# Patient Record
Sex: Male | Born: 1970 | Race: White | Hispanic: No | Marital: Single | Smoking: Current every day smoker
Health system: Southern US, Community
[De-identification: ages and names within clinical notes are randomized; demographics above are authoritative.]

## PROBLEM LIST (undated history)

## (undated) HISTORY — PX: ANKLE FRACTURE SURGERY: SHX122

## (undated) HISTORY — PX: LEG SURGERY: SHX1003

---

## 2004-12-02 ENCOUNTER — Emergency Department: Payer: Self-pay | Admitting: Emergency Medicine

## 2010-06-30 ENCOUNTER — Inpatient Hospital Stay: Payer: Self-pay | Admitting: Specialist

## 2010-11-16 ENCOUNTER — Emergency Department: Payer: Self-pay | Admitting: Emergency Medicine

## 2011-02-02 ENCOUNTER — Ambulatory Visit: Payer: Self-pay | Admitting: Oncology

## 2011-02-13 ENCOUNTER — Ambulatory Visit: Payer: Self-pay | Admitting: Oncology

## 2011-03-16 ENCOUNTER — Ambulatory Visit: Payer: Self-pay | Admitting: Oncology

## 2012-11-08 ENCOUNTER — Emergency Department: Payer: Self-pay | Admitting: Internal Medicine

## 2014-02-04 ENCOUNTER — Emergency Department: Payer: Self-pay | Admitting: Emergency Medicine

## 2014-03-15 DIAGNOSIS — S36039A Unspecified laceration of spleen, initial encounter: Secondary | ICD-10-CM

## 2014-03-15 HISTORY — DX: Unspecified laceration of spleen, initial encounter: S36.039A

## 2014-07-01 ENCOUNTER — Emergency Department
Admit: 2014-07-01 | Disposition: A | Discharge status: Home / Self Care | Payer: Self-pay | Admitting: Emergency Medicine

## 2014-07-01 LAB — ETHANOL: Ethanol: 271 mg/dL

## 2014-07-04 ENCOUNTER — Emergency Department
Admit: 2014-07-04 | Disposition: A | Discharge status: Home / Self Care | Payer: Self-pay | Admitting: Emergency Medicine

## 2015-04-06 ENCOUNTER — Encounter: Payer: Self-pay | Admitting: Emergency Medicine

## 2015-04-06 ENCOUNTER — Emergency Department
Admission: EM | Admit: 2015-04-06 | Discharge: 2015-04-06 | Disposition: A | Discharge status: Home / Self Care | Payer: Self-pay | Attending: Emergency Medicine | Admitting: Emergency Medicine

## 2015-04-06 ENCOUNTER — Emergency Department: Payer: Self-pay

## 2015-04-06 DIAGNOSIS — F17200 Nicotine dependence, unspecified, uncomplicated: Secondary | ICD-10-CM | POA: Insufficient documentation

## 2015-04-06 DIAGNOSIS — Y998 Other external cause status: Secondary | ICD-10-CM | POA: Insufficient documentation

## 2015-04-06 DIAGNOSIS — Y9389 Activity, other specified: Secondary | ICD-10-CM | POA: Insufficient documentation

## 2015-04-06 DIAGNOSIS — Y9289 Other specified places as the place of occurrence of the external cause: Secondary | ICD-10-CM | POA: Insufficient documentation

## 2015-04-06 DIAGNOSIS — X58XXXA Exposure to other specified factors, initial encounter: Secondary | ICD-10-CM | POA: Insufficient documentation

## 2015-04-06 DIAGNOSIS — M7531 Calcific tendinitis of right shoulder: Secondary | ICD-10-CM | POA: Insufficient documentation

## 2015-04-06 MED ORDER — HYDROCODONE-ACETAMINOPHEN 5-325 MG PO TABS: 1.0000 | ORAL_TABLET | ORAL | Status: DC | PRN

## 2015-04-06 MED ORDER — HYDROCODONE-ACETAMINOPHEN 5-325 MG PO TABS
2.0000 | ORAL_TABLET | Freq: Once | ORAL | Status: AC
  Administered 2015-04-06: 2 via ORAL
  Filled 2015-04-06: qty 2

## 2015-04-06 MED ORDER — PREDNISONE 10 MG PO TABS: ORAL_TABLET | ORAL | Status: DC

## 2015-04-06 NOTE — ED Notes (Signed)
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 Pain getting worse.  Having difficulty using arm now because of pain.

## 2015-04-06 NOTE — ED Provider Notes (Signed)
[facility] Emergency Department Provider Note  ____________________________________________  Time seen: Approximately 11:05 AM  I have reviewed the triage vital signs and the nursing notes.   HISTORY  Chief Complaint Shoulder Pain   HPI Mario Potter is a 45 y.o. male is here complaining of right shoulder pain for 2 weeks. Patient states that he was working with a tree when the rope snapped pulling on his shoulder. He states that he has continued to have pain since that time. He has been taking ibuprofen 600 milligrams 3 times a day. He denies any prior problems with his arm or shoulder. During the night last night he began to get worse. Patient rates his pain as an 8/10.   History reviewed. No pertinent past medical history.  There are no active problems to display for this patient.   Past Surgical History  Procedure Laterality Date  . Leg surgery      Current Outpatient Rx  Name  Route  Sig  Dispense  Refill  . HYDROcodone-acetaminophen (NORCO/VICODIN) 5-325 MG tablet   Oral   Take 1 tablet by mouth every 4 (four) hours as needed for moderate pain.   20 tablet   0   . predniSONE (DELTASONE) 10 MG tablet      Take 6 tablets  today, on day 2 take 5 tablets, day 3 take 4 tablets, day 4 take 3 tablets, day 5 take  2 tablets and 1 tablet the last day   21 tablet   0     Allergies Review of patient's allergies indicates no known allergies.  History reviewed. No pertinent family history.  Social History Social History  Substance Use Topics  . Smoking status: Current Every Day Smoker  . Smokeless tobacco: None  . Alcohol Use: No    Review of Systems Constitutional: No fever/chills Cardiovascular: Denies chest pain. Respiratory: Denies shortness of breath. Gastrointestinal:  No nausea, no vomiting.   Genitourinary: Negative for dysuria. Musculoskeletal: Negative for back pain. Positive right shoulder pain. Skin: Negative for  rash. Neurological: Negative for headaches, focal weakness or numbness.  10-point ROS otherwise negative.  ____________________________________________   PHYSICAL EXAM:  VITAL SIGNS: ED Triage Vitals  Enc Vitals Group     BP 04/06/15 1034 133/84 mmHg     Pulse Rate 04/06/15 1034 77     Resp 04/06/15 1034 18     Temp 04/06/15 1034 97.9 F (36.6 C)     Temp Source 04/06/15 1034 Oral     SpO2 04/06/15 1034 100 %     Weight 04/06/15 1034 160 lb (72.576 kg)     Height 04/06/15 1034  (1.727 m)     Head Cir --      Peak Flow --      Pain Score 04/06/15 1035 8     Pain Loc --      Pain Edu? --      Excl. in GC? --     Constitutional: Alert and oriented. Well appearing and in no acute distress. Eyes: Conjunctivae are normal. PERRL. EOMI. Head: Atraumatic. Nose: No congestion/rhinnorhea. Neck: No stridor.  No tenderness on palpation posterior cervical spine. Cardiovascular: Normal rate, regular rhythm. Grossly normal heart sounds.  Good peripheral circulation. Respiratory: Normal respiratory effort.  No retractions. Lungs CTAB. Gastrointestinal: Soft and nontender. No distention.  Musculoskeletal: Right shoulder no gross deformity however there is marked tenderness on palpation of the trapezius and rhomboid muscle. There is also tenderness on palpation of the  AC joint area  and deltoid. Range of motion is markedly restricted secondary to patient's pain. There is some crepitus noted on range of motion. Motor sensory function is intact distal to this area. Neurologic:  Normal speech and language. No gross focal neurologic deficits are appreciated. No gait instability. Skin:  Skin is warm, dry and intact. No rash noted. Psychiatric: Mood and affect are normal. Speech and behavior are normal.  ____________________________________________   LABS (all labs ordered are listed, but only abnormal results are displayed)  Labs Reviewed - No data to display   RADIOLOGY  Right  shoulder x-ray per radiologist shows no evidence of fracture dislocation. There is however an area representing calcific tendinitis of the rotator cuff. Soft tissue unremarkable.  ____________________________________________   PROCEDURES  Procedure(s) performed: None  Critical Care performed: No  ____________________________________________   INITIAL IMPRESSION / ASSESSMENT AND PLAN / ED COURSE  Pertinent labs & imaging results that were available during my care of the patient were reviewed by me and considered in my medical decision making (see chart for details).  Patient was given Norco while in the emergency room. He is discharged on a prescription of prednisone 60 mg 6 day tapering dose with Norco. He is also given a note for work. He is follow-up with Dr. Joice Lofts if any continued problems.  ____________________________________________   FINAL CLINICAL IMPRESSION(S) / ED DIAGNOSES  Final diagnoses:  Calcific tendonitis of right shoulder      Tommi Rumps, PA-C 04/06/15 1218  Jene Every, MD 04/06/15 1556

## 2015-04-06 NOTE — Discharge Instructions (Signed)
Calcific Tendinitis Calcific tendinitis occurs when crystals of calcium are deposited in a tendon. Tendons are bands of strong, fibrous tissue that attach muscles to bones. Tendons are an important part of joints. They make the joint move and they absorb some of the stress that a joint receives during use. When calcium is deposited in the tendon, the tendon becomes stiff, painful, and it can become swollen. Calcific tendinitis occurs frequently in the shoulder joint, in a structure called the rotator cuff. CAUSES  The cause of calcific tendinitis is unclear. It may be associated with:  Overuse of the tendon, such as from repetitive motion.  Excess stress on the tendon.  Aging.  Repetitive, mild injuries. SYMPTOMS   Pain may or may not be present. If it is present, it may occur when moving the joint.  Tenderness when pressure is applied to the tendon.  A snapping or popping sound when the joint moves.  Decreased motion of the joint.  Difficulty sleeping due to pain in the joint. DIAGNOSIS  Your health care provider will perform a physical exam. Imaging tests may also be used to make the diagnosis. These may include X-rays, an MRI, or a CT scan. TREATMENT  Generally, calcific tendinitis resolves on its own. Treatment for pain of calcific tendinitis may include:  Taking over-the-counter medicines, such as anti-inflammatory drugs.  Applying ice packs to the joint.  Following a specific exercise program to keep the joint working properly.  Attending physical therapy sessions.  Avoiding activities that cause pain. Treatment for more severe calcific tendinitis may require:  Injecting cortisone steroids or pain relieving medicines into or around the joint.  Manipulating the joint after you are given medicine to numb the area (local anesthetic).  Inflating the joint with sterile fluid to increase the flexibility of the tendons.  Shock wave therapy, which involves focusing sound  waves on the joint. If other treatments do not work, surgery may be done to clean out the calcium deposits and repair the tendons where needed. Most people do not need surgery. HOME CARE INSTRUCTIONS   Only take over-the-counter or prescription medicines for pain, fever, or discomfort as directed by your health care provider.  Follow your health care provider's recommendations for activity and exercise. SEEK MEDICAL CARE IF:  You notice an increase in pain or numbness.  You develop new weakness.  You notice increased joint stiffness or a sensation of looseness in the joint.  You notice increasing redness, swelling, or warmth around the joint area. SEEK IMMEDIATE MEDICAL CARE IF:  You have a fever or persistent symptoms for more than 2 to 3 days.  You have a fever and your symptoms suddenly get worse. MAKE SURE YOU:  Understand these instructions.  Will watch your condition.  Will get help right away if you are not doing well or get worse.   This information is not intended to replace advice given to you by your health care provider. Make sure you discuss any questions you have with your health care provider.   Document Released: 12/09/2007 Document Revised: 11/20/2014 Document Reviewed: 06/10/2011 Elsevier Interactive Patient Education [employer]    Follow-up Dr. Joice Lofts if any continued problems. Take medication as directed. Norco if needed for severe pain. Begin taking prednisone as directed in a tapering dose. Wear the sling only for the next 2-3 days to prevent stiffness.

## 2015-06-19 ENCOUNTER — Encounter: Payer: Self-pay | Admitting: Emergency Medicine

## 2015-06-19 ENCOUNTER — Emergency Department
Admission: EM | Admit: 2015-06-19 | Discharge: 2015-06-19 | Disposition: A | Discharge status: Home / Self Care | Payer: No Typology Code available for payment source | Attending: Emergency Medicine | Admitting: Emergency Medicine

## 2015-06-19 ENCOUNTER — Emergency Department: Payer: No Typology Code available for payment source

## 2015-06-19 DIAGNOSIS — Y92410 Unspecified street and highway as the place of occurrence of the external cause: Secondary | ICD-10-CM | POA: Diagnosis not present

## 2015-06-19 DIAGNOSIS — Y939 Activity, unspecified: Secondary | ICD-10-CM | POA: Diagnosis not present

## 2015-06-19 DIAGNOSIS — S46011A Strain of muscle(s) and tendon(s) of the rotator cuff of right shoulder, initial encounter: Secondary | ICD-10-CM | POA: Insufficient documentation

## 2015-06-19 DIAGNOSIS — M25511 Pain in right shoulder: Secondary | ICD-10-CM | POA: Diagnosis present

## 2015-06-19 DIAGNOSIS — F17200 Nicotine dependence, unspecified, uncomplicated: Secondary | ICD-10-CM | POA: Insufficient documentation

## 2015-06-19 DIAGNOSIS — Y999 Unspecified external cause status: Secondary | ICD-10-CM | POA: Diagnosis not present

## 2015-06-19 DIAGNOSIS — F1210 Cannabis abuse, uncomplicated: Secondary | ICD-10-CM | POA: Insufficient documentation

## 2015-06-19 DIAGNOSIS — S46911A Strain of unspecified muscle, fascia and tendon at shoulder and upper arm level, right arm, initial encounter: Secondary | ICD-10-CM

## 2015-06-19 MED ORDER — CYCLOBENZAPRINE HCL 10 MG PO TABS: 10.0000 mg | ORAL_TABLET | Freq: Three times a day (TID) | ORAL | Status: DC | PRN

## 2015-06-19 MED ORDER — IBUPROFEN 800 MG PO TABS: 800.0000 mg | ORAL_TABLET | Freq: Three times a day (TID) | ORAL | Status: DC | PRN

## 2015-06-19 MED ORDER — KETOROLAC TROMETHAMINE 60 MG/2ML IM SOLN
60.0000 mg | Freq: Once | INTRAMUSCULAR | Status: AC
  Administered 2015-06-19: 60 mg via INTRAMUSCULAR
  Filled 2015-06-19: qty 2
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
## 2019-06-07 ENCOUNTER — Emergency Department: Payer: Self-pay

## 2019-06-07 ENCOUNTER — Emergency Department
Admission: EM | Admit: 2019-06-07 | Discharge: 2019-06-07 | Disposition: A | Discharge status: Home / Self Care | Payer: Self-pay | Attending: Emergency Medicine | Admitting: Emergency Medicine

## 2019-06-07 ENCOUNTER — Other Ambulatory Visit: Payer: Self-pay

## 2019-06-07 DIAGNOSIS — R079 Chest pain, unspecified: Secondary | ICD-10-CM

## 2019-06-07 DIAGNOSIS — F17200 Nicotine dependence, unspecified, uncomplicated: Secondary | ICD-10-CM

## 2019-06-07 DIAGNOSIS — F17210 Nicotine dependence, cigarettes, uncomplicated: Secondary | ICD-10-CM | POA: Insufficient documentation

## 2019-06-07 DIAGNOSIS — F1210 Cannabis abuse, uncomplicated: Secondary | ICD-10-CM | POA: Insufficient documentation

## 2019-06-07 DIAGNOSIS — R0789 Other chest pain: Secondary | ICD-10-CM | POA: Insufficient documentation

## 2019-06-07 DIAGNOSIS — H6123 Impacted cerumen, bilateral: Secondary | ICD-10-CM | POA: Insufficient documentation

## 2019-06-07 LAB — CBC WITH DIFFERENTIAL/PLATELET
Abs Immature Granulocytes: 0.07 10*3/uL (ref 0.00–0.07)
Basophils Absolute: 0.1 10*3/uL (ref 0.0–0.1)
Basophils Relative: 0 %
Eosinophils Absolute: 0 10*3/uL (ref 0.0–0.5)
Eosinophils Relative: 0 %
HCT: 52 % (ref 39.0–52.0)
Hemoglobin: 17.6 g/dL — ABNORMAL HIGH (ref 13.0–17.0)
Immature Granulocytes: 1 %
Lymphocytes Relative: 14 %
Lymphs Abs: 2 10*3/uL (ref 0.7–4.0)
MCH: 33.7 pg (ref 26.0–34.0)
MCHC: 33.8 g/dL (ref 30.0–36.0)
MCV: 99.6 fL (ref 80.0–100.0)
Monocytes Absolute: 1.3 10*3/uL — ABNORMAL HIGH (ref 0.1–1.0)
Monocytes Relative: 9 %
Neutro Abs: 11.1 10*3/uL — ABNORMAL HIGH (ref 1.7–7.7)
Neutrophils Relative %: 76 %
Platelets: 455 10*3/uL — ABNORMAL HIGH (ref 150–400)
RBC: 5.22 MIL/uL (ref 4.22–5.81)
RDW: 13.2 % (ref 11.5–15.5)
WBC: 14.5 10*3/uL — ABNORMAL HIGH (ref 4.0–10.5)
nRBC: 0 % (ref 0.0–0.2)

## 2019-06-07 LAB — TROPONIN I (HIGH SENSITIVITY): Troponin I (High Sensitivity): 3 ng/L (ref <18)

## 2019-06-07 LAB — BASIC METABOLIC PANEL
Anion gap: 12 (ref 5–15)
BUN: 14 mg/dL (ref 6–20)
CO2: 26 mmol/L (ref 22–32)
Calcium: 10.5 mg/dL — ABNORMAL HIGH (ref 8.9–10.3)
Chloride: 103 mmol/L (ref 98–111)
Creatinine, Ser: 0.95 mg/dL (ref 0.61–1.24)
GFR calc Af Amer: >60 mL/min (ref >60)
GFR calc non Af Amer: >60 mL/min (ref >60)
Glucose, Bld: 108 mg/dL — ABNORMAL HIGH (ref 70–99)
Potassium: 3.5 mmol/L (ref 3.5–5.1)
Sodium: 141 mmol/L (ref 135–145)

## 2019-06-07 MED ORDER — CYCLOBENZAPRINE HCL 10 MG PO TABS: 10.0000 mg | ORAL_TABLET | Freq: Three times a day (TID) | ORAL | 0 refills | Status: DC | PRN

## 2019-06-07 MED ORDER — DEXAMETHASONE SODIUM PHOSPHATE 10 MG/ML IJ SOLN
10.0000 mg | Freq: Once | INTRAMUSCULAR | Status: AC
  Administered 2019-06-07: 10 mg via INTRAVENOUS
  Filled 2019-06-07: qty 1

## 2019-06-07 MED ORDER — KETOROLAC TROMETHAMINE 30 MG/ML IJ SOLN
30.0000 mg | Freq: Once | INTRAMUSCULAR | Status: AC
  Administered 2019-06-07: 30 mg via INTRAVENOUS
  Filled 2019-06-07: qty 1
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
## 2019-06-07 NOTE — ED Provider Notes (Signed)
[facility] Emergency Department Provider Note  ____________________________________________   First MD Initiated Contact with Patient 06/07/19 [phone]     (approximate)  I have reviewed the triage vital signs and the nursing notes.   HISTORY  Chief Complaint Chest Pain    HPI Mario Potter is a 49 y.o. male  Here with chest pain and neck pain.  The patient states that his chest pain started approximately 2 to 3 months ago.  He scribes it as an aching, cramping, but intermittently sharp left primarily posterior chest pain.  It wraps around to his front.  The pain is more noticeable when he is still, and he states it does improve somewhat when he gets up and stretches or presses on the area.  The pain is worse with some position changes.  He has a chronic cough due to smoking, but does feel this is possibly mildly worsened.  No hemoptysis.  No leg swelling.  He denies known history of coronary disease.  He does continue to smoke at least 1 pack daily.  He also complains of some intermittent pain and swelling sensation along the right lateral throat.  He states he feels like food gets stuck in his throat and he occasionally has difficulty swallowing.  This is also been ongoing for several months.  He has not seen a doctor for this.  Denies any weight loss or night sweats.  No known history of oral or lung cancer.  No fevers or chills.          History reviewed. No pertinent past medical history.  There are no problems to display for this patient.   Past Surgical History:  Procedure Laterality Date  . LEG SURGERY      Prior to Admission medications   Medication Sig Start Date End Date Taking? Authorizing Provider  cyclobenzaprine (FLEXERIL) 10 MG tablet Take 1 tablet (10 mg total) by mouth 3 (three) times daily as needed for muscle spasms (or muscle tension). 06/07/19   Shaune Pollack, MD  ibuprofen (ADVIL,MOTRIN) 800 MG tablet Take 1 tablet (800 mg total) by  mouth every 8 (eight) hours as needed. 06/19/15   Beers, Charmayne Sheer, PA-C    Allergies Patient has no known allergies.  No family history on file.  Social History Social History   Tobacco Use  . Smoking status: Current Every Day Smoker  . Smokeless tobacco: Never Used  Substance Use Topics  . Alcohol use: No  . Drug use: Yes    Types: Marijuana    Review of Systems  Review of Systems  Constitutional: Positive for fatigue. Negative for chills and fever.  HENT: Positive for trouble swallowing. Negative for sore throat.   Respiratory: Positive for cough and chest tightness. Negative for shortness of breath.   Cardiovascular: Negative for chest pain.  Gastrointestinal: Negative for abdominal pain.  Genitourinary: Negative for flank pain.  Musculoskeletal: Positive for neck pain.  Skin: Negative for rash and wound.  Allergic/Immunologic: Negative for immunocompromised state.  Neurological: Negative for weakness and numbness.  Hematological: Does not bruise/bleed easily.  All other systems reviewed and are negative.    ____________________________________________  PHYSICAL EXAM:      VITAL SIGNS: ED Triage Vitals  Enc Vitals Group     BP 06/07/19 0733 (!) 151/77     Pulse Rate 06/07/19 0733 69     Resp 06/07/19 0733 18     Temp 06/07/19 0733 98.2 F (36.8 C)     Temp  Source 06/07/19 0733 Oral     SpO2 06/07/19 0733 97 %     Weight 06/07/19 0727 170 lb (77.1 kg)     Height 06/07/19 0727 5\' 8"  (1.727 m)     Head Circumference --      Peak Flow --      Pain Score 06/07/19 0727 5     Pain Loc --      Pain Edu? --      Excl. in GC? --      Physical Exam Vitals and nursing note reviewed.  Constitutional:      General: He is not in acute distress.    Appearance: He is well-developed.  HENT:     Head: Normocephalic and atraumatic.  Eyes:     Conjunctiva/sclera: Conjunctivae normal.  Neck:     Comments: Mild TTP over R lateral hyoid bone. No appreciable masses or  LAD. No obvious abnormality on swallowing or speaking. No hoarseness. Cardiovascular:     Rate and Rhythm: Normal rate and regular rhythm.     Heart sounds: Normal heart sounds.  Pulmonary:     Effort: Pulmonary effort is normal. No respiratory distress.     Breath sounds: No wheezing.  Chest:     Comments: Moderate TTP along left posterior and lateral chest wall intercostal spaces. Normal WOB, breath sounds symmetric. Abdominal:     General: There is no distension.  Musculoskeletal:     Cervical back: Neck supple.  Skin:    General: Skin is warm.     Capillary Refill: Capillary refill takes less than 2 seconds.     Findings: No rash.  Neurological:     Mental Status: He is alert and oriented to person, place, and time.     Motor: No abnormal muscle tone.       ____________________________________________   LABS (all labs ordered are listed, but only abnormal results are displayed)  Labs Reviewed  CBC WITH DIFFERENTIAL/PLATELET - Abnormal; Notable for the following components:      Result Value   WBC 14.5 (*)    Hemoglobin 17.6 (*)    Platelets 455 (*)    Neutro Abs 11.1 (*)    Monocytes Absolute 1.3 (*)    All other components within normal limits  BASIC METABOLIC PANEL - Abnormal; Notable for the following components:   Glucose, Bld 108 (*)    Calcium 10.5 (*)    All other components within normal limits  TROPONIN I (HIGH SENSITIVITY)  TROPONIN I (HIGH SENSITIVITY)    ____________________________________________  EKG: Sinus bradycardia, VR 58. PR 182, QRS 96, QTc 382. No acute ST elevations or depressions. No ischemia or infarct. ________________________________________  RADIOLOGY All imaging, including plain films, CT scans, and ultrasounds, independently reviewed by me, and interpretations confirmed via formal radiology reads.  ED MD interpretation:   CXR clear XR Neck clear  Official radiology report(s): DG Neck Soft Tissue  Result Date: 06/07/2019  CLINICAL DATA:  Neck pain occasional swallowing difficulty. EXAM: NECK SOFT TISSUES - 1+ VIEW COMPARISON:  None. FINDINGS: There is no evidence of retropharyngeal soft tissue swelling or epiglottic enlargement. The cervical airway is unremarkable and no radio-opaque foreign body identified. IMPRESSION: Negative. Electronically Signed   By: 06/09/2019 M.D.   On: 06/07/2019 08:36   DG Chest 2 View  Result Date: 06/07/2019 CLINICAL DATA:  Chest pain, cough. EXAM: CHEST - 2 VIEW COMPARISON:  July 04, 2014. FINDINGS: The heart size and mediastinal contours are within normal  limits. Both lungs are clear. No pneumothorax or pleural effusion is noted. The visualized skeletal structures are unremarkable. IMPRESSION: No active cardiopulmonary disease. Electronically Signed   By: Lupita Raider M.D.   On: 06/07/2019 08:35    ____________________________________________  PROCEDURES   Procedure(s) performed (including Critical Care):  .Ear Cerumen Removal  Date/Time: 06/07/2019 10:00 AM Performed by: Shaune Pollack, MD Authorized by: Shaune Pollack, MD   Consent:    Consent obtained:  Written   Consent given by:  Patient   Risks discussed:  Bleeding, dizziness, infection, incomplete removal, pain and TM perforation   Alternatives discussed:  Alternative treatment Procedure details:    Location:  R ear and L ear   Procedure type: curette   Post-procedure details:    Inspection:  Macerated skin and TM intact   Hearing quality:  Improved   Patient tolerance of procedure:  Tolerated well, no immediate complications    ____________________________________________  INITIAL IMPRESSION / MDM / ASSESSMENT AND PLAN / ED COURSE  As part of my medical decision making, I reviewed the following data within the electronic MEDICAL RECORD NUMBER Nursing notes reviewed and incorporated, Old chart reviewed, Notes from prior ED visits, and [place] Controlled Substance Database       *Mario Potter was  evaluated in Emergency Department on 06/07/2019 for the symptoms described in the history of present illness. He was evaluated in the context of the global COVID-19 pandemic, which necessitated consideration that the patient might be at risk for infection with the SARS-CoV-2 virus that causes COVID-19. Institutional protocols and algorithms that pertain to the evaluation of patients at risk for COVID-19 are in a state of rapid change based on information released by regulatory bodies including the CDC and federal and state organizations. These policies and algorithms were followed during the patient's care in the ED.  Some ED evaluations and interventions may be delayed as a result of limited staffing during the pandemic.*  Clinical Course as of Jun 06 999  Thu Jun 07, 2019  0828 Suspect CP is MSK in etiology related to his fall and ongoing coughing from smoking. No tachycardia, tachypnea, hypoxia, leg swelling, or s/s DVT/PE. DDx includes occult PNA, less likely lung CA though he certainly is at risk. CXR pending. Neck pain could also be related to pharyngeal irritation from his chronic coughing. No palpable masses or LAD. No signs of airway compromise. Will ultimately need ENT evaluation.   [CI]    Clinical Course User Index [CI] Shaune Pollack, MD    Medical Decision Making: As above.  Chest pain is likely musculoskeletal in etiology.  He is not tachycardic, tachypneic, doubt PE.  Chest x-ray is clear.  EKG nonischemic and troponin negative despite constant symptoms for greater than 48 hours, do not suspect ACS.  Will treat with Flexeril.  There may also be component of both anxiety as well as acid reflux, and will have him stop his ibuprofen use as this could be contributing.  Will advise him to take Tylenol and start an over-the-counter antacid.  Regarding his jaw pain, plain film of the neck is unremarkable.  Of note, he had bilateral cerumen impactions which after removal, did significantly  improve his right upper neck pain, so this very well could have been referred from the ear canal.  I discussed that he would need outpatient follow-up with an ENT if symptoms do not improve, as well as regular cancer screenings given his tobacco use.  Counseled the patient extensively on  the benefits of quitting smoking.  Otherwise, no apparent emergent pathology.  ____________________________________________  FINAL CLINICAL IMPRESSION(S) / ED DIAGNOSES  Final diagnoses:  Left-sided chest pain  Bilateral impacted cerumen  Tobacco dependence     MEDICATIONS GIVEN DURING THIS VISIT:  Medications  ketorolac (TORADOL) 30 MG/ML injection 30 mg (30 mg Intravenous Given 06/07/19 0907)  dexamethasone (DECADRON) injection 10 mg (10 mg Intravenous Given 06/07/19 0907)     ED Discharge Orders         Ordered    cyclobenzaprine (FLEXERIL) 10 MG tablet  3 times daily PRN     06/07/19 0957           Note:  This document was prepared using Dragon voice recognition software and may include unintentional dictation errors.   Duffy Bruce, MD 06/07/19 1001

## 2019-06-07 NOTE — Discharge Instructions (Addendum)
For your chest pain:  Take ACETAMINOPHEN or TYLENOL instead of MOTRIN/IBUPROFEN or ASPIRIN Take the Flexeril that I have prescribed. This also helps with muscle tension. STOP SMOKING  For your jaw pain: Consider an over-the-counter ear drop like Debrox Do not stick QTips or other instruments into your ears  For your burning pain: Take over-the-counter OMEPRAZOLE (generic for Prilosec) once daily for 14 days, to see if this helps Take first thing in the morning This medication takes several days to work, but continue taking it as I have written

## 2019-09-18 ENCOUNTER — Telehealth: Payer: Self-pay | Admitting: General Practice

## 2019-09-18 NOTE — Telephone Encounter (Signed)
Individual has been contacted 3+ times regarding ED referral and has been given information regarding how to become a pt. No further attempts to contact individual will be made. 

## 2019-11-23 ENCOUNTER — Emergency Department: Payer: Self-pay

## 2019-11-23 ENCOUNTER — Emergency Department
Admission: EM | Admit: 2019-11-23 | Discharge: 2019-11-23 | Disposition: A | Discharge status: Home / Self Care | Payer: Self-pay | Attending: Emergency Medicine | Admitting: Emergency Medicine

## 2019-11-23 ENCOUNTER — Other Ambulatory Visit: Payer: Self-pay

## 2019-11-23 DIAGNOSIS — F1210 Cannabis abuse, uncomplicated: Secondary | ICD-10-CM | POA: Insufficient documentation

## 2019-11-23 DIAGNOSIS — F17200 Nicotine dependence, unspecified, uncomplicated: Secondary | ICD-10-CM | POA: Insufficient documentation

## 2019-11-23 DIAGNOSIS — M25571 Pain in right ankle and joints of right foot: Secondary | ICD-10-CM | POA: Insufficient documentation

## 2019-11-23 DIAGNOSIS — Z79899 Other long term (current) drug therapy: Secondary | ICD-10-CM | POA: Insufficient documentation

## 2019-11-23 DIAGNOSIS — K047 Periapical abscess without sinus: Secondary | ICD-10-CM | POA: Insufficient documentation

## 2019-11-23 DIAGNOSIS — R52 Pain, unspecified: Secondary | ICD-10-CM

## 2019-11-23 MED ORDER — MELOXICAM 15 MG PO TABS: 15.0000 mg | ORAL_TABLET | Freq: Every day | ORAL | 0 refills | Status: DC

## 2019-11-23 MED ORDER — AMOXICILLIN 500 MG PO TABS: 500.0000 mg | ORAL_TABLET | Freq: Three times a day (TID) | ORAL | 0 refills | Status: DC

## 2019-11-23 NOTE — Discharge Instructions (Signed)
Please follow up with a dentist as well as podiatry.  Return to the ER for symptoms that change or worsen if unable to schedule an appointment.

## 2019-11-23 NOTE — ED Triage Notes (Signed)
Pt states that he popped his rt ankle a week ago and cont to have swelling and discomfort, reports hx of plates and screws in his rt ankle that were removed, pt also states that he is having an abscessed tooth

## 2019-11-23 NOTE — ED Provider Notes (Signed)
[facility] Emergency Department Provider Note ____________________________________________  Time seen: Approximately 3:23 PM  I have reviewed the triage vital signs and the nursing notes.   HISTORY  Chief Complaint Ankle Pain    HPI Mario Potter is a 49 y.o. male who presents to the emergency department for evaluation and treatment of right ankle pain.  He has a history of fracture and couple of surgeries on the ankle to remove the hardware several years later.  He states that about a week ago he was just walking and his ankle popped.  It has continued to swell and has been painful.  Patient also states that he is experiencing an abscessed tooth  No past medical history on file.  There are no problems to display for this patient.   Past Surgical History:  Procedure Laterality Date   LEG SURGERY      Prior to Admission medications   Medication Sig Start Date End Date Taking? Authorizing Provider  amoxicillin (AMOXIL) 500 MG tablet Take 1 tablet (500 mg total) by mouth 3 (three) times daily. 11/23/19   Nicco Reaume, Rulon Eisenmenger B, FNP  cyclobenzaprine (FLEXERIL) 10 MG tablet Take 1 tablet (10 mg total) by mouth 3 (three) times daily as needed for muscle spasms (or muscle tension). 06/07/19   Shaune Pollack, MD  ibuprofen (ADVIL,MOTRIN) 800 MG tablet Take 1 tablet (800 mg total) by mouth every 8 (eight) hours as needed. 06/19/15   Beers, Charmayne Sheer, PA-C  meloxicam (MOBIC) 15 MG tablet Take 1 tablet (15 mg total) by mouth daily. 11/23/19   Chinita Pester, FNP    Allergies Patient has no known allergies.  No family history on file.  Social History Social History   Tobacco Use   Smoking status: Current Every Day Smoker   Smokeless tobacco: Never Used  Substance Use Topics   Alcohol use: No   Drug use: Yes    Types: Marijuana    Review of Systems Constitutional: Negative for fever. Cardiovascular: Negative for chest pain. ENT: Positive for dental  pain. Respiratory: Negative for shortness of breath. Musculoskeletal: Positive for right ankle pain Skin: Negative for open wounds or lesions. Neurological: Negative for decrease in sensation  ____________________________________________   PHYSICAL EXAM:  VITAL SIGNS: ED Triage Vitals [11/23/19 1229]  Enc Vitals Group     BP (!) 166/95     Pulse Rate 72     Resp 18     Temp 99.2 F (37.3 C)     Temp Source Oral     SpO2 99 %     Weight 175 lb (79.4 kg)     Height 5\' 8"  (1.727 m)     Head Circumference      Peak Flow      Pain Score 6     Pain Loc      Pain Edu?      Excl. in GC?     Constitutional: Alert and oriented. Well appearing and in no acute distress. Eyes: Conjunctivae are clear without discharge or drainage Head: Atraumatic Neck: Supple. ENT: Swelling and erythema over the lateral incisor on the left upper without an identified area of fluctuance. Respiratory: No cough. Respirations are even and unlabored. Musculoskeletal: Diffuse right ankle pain without deformity.  Able to perform limited range of motion.  Neurologic: Motor and sensory function is intact. Skin: No open wounds or lesions. Psychiatric: Affect and behavior are appropriate.  ____________________________________________   LABS (all labs ordered are listed, but only abnormal  results are displayed)  Labs Reviewed - No data to display ____________________________________________  RADIOLOGY  Image of the right ankle is negative for acute findings.  I, Kem Boroughs, personally viewed and evaluated these images (plain radiographs) as part of my medical decision making, as well as reviewing the written report by the radiologist.  DG Foot Complete Right  Result Date: 11/23/2019 CLINICAL DATA:  The patient felt a pop in his right ankle approximately 1 week ago with onset of pain. EXAM: RIGHT FOOT COMPLETE - 3+ VIEW COMPARISON:  None. FINDINGS: No fracture or dislocation is identified. Multiple  rounded calcifications project about the tibiotalar joint eccentric on the medial side. 2 calcifications are also seen posterior to the tibiotalar joint and subtalar joint. Tibiotalar joint space is maintained. IMPRESSION: No acute abnormality. 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 Electronically Signed   By: Drusilla Kanner M.D.   On: 11/23/2019 12:54   ____________________________________________   PROCEDURES  Procedures  ____________________________________________   INITIAL IMPRESSION / ASSESSMENT AND PLAN / ED COURSE  KOBEY SIDES is a 49 y.o. who presents to the emergency department for treatment and evaluation of right ankle pain.  See HPI for further details.  He is also here for treatment of dental abscess.  Plan will be to put him in a lace up brace and treat him for the dental abscess.  He'll receive prescriptions for amoxicillin and meloxicam.  He was encouraged to follow-up with podiatry if her symptoms are not improving over the week.  He is to rest, ice, and elevate over the weekend.  Medications - No data to display  Pertinent labs & imaging results that were available during my care of the patient were reviewed by me and considered in my medical decision making (see chart for details).   _________________________________________   FINAL CLINICAL IMPRESSION(S) / ED DIAGNOSES  Final diagnoses:  Pain  Acute right ankle pain  Dental abscess    ED Discharge Orders         Ordered    amoxicillin (AMOXIL) 500 MG tablet  3 times daily        11/23/19 1412    meloxicam (MOBIC) 15 MG tablet  Daily        11/23/19 1412           If controlled substance prescribed during this visit, 12 month history viewed on the [facility] prior to issuing an initial prescription for Schedule II or III opiod.   Chinita Pester,  FNP 11/23/19 1528    Gilles Chiquito, MD 11/23/19 [phone]

## 2019-11-23 NOTE — ED Notes (Signed)
Pt roomed by this RN in wheelchair. Pt stating his right ankle popped and he has previously broken it. Pt stating he has also noticed swelling to his face and arms.

## 2020-02-23 ENCOUNTER — Other Ambulatory Visit: Payer: Self-pay

## 2020-02-23 ENCOUNTER — Emergency Department: Payer: Self-pay

## 2020-02-23 ENCOUNTER — Encounter: Payer: Self-pay | Admitting: Emergency Medicine

## 2020-02-23 ENCOUNTER — Emergency Department
Admission: EM | Admit: 2020-02-23 | Discharge: 2020-02-23 | Disposition: A | Discharge status: Home / Self Care | Payer: Self-pay | Attending: Emergency Medicine | Admitting: Emergency Medicine

## 2020-02-23 DIAGNOSIS — M179 Osteoarthritis of knee, unspecified: Secondary | ICD-10-CM | POA: Insufficient documentation

## 2020-02-23 DIAGNOSIS — F17200 Nicotine dependence, unspecified, uncomplicated: Secondary | ICD-10-CM | POA: Insufficient documentation

## 2020-02-23 DIAGNOSIS — M1712 Unilateral primary osteoarthritis, left knee: Secondary | ICD-10-CM

## 2020-02-23 LAB — CBC WITH DIFFERENTIAL/PLATELET
Abs Immature Granulocytes: 0.03 10*3/uL (ref 0.00–0.07)
Basophils Absolute: 0.1 10*3/uL (ref 0.0–0.1)
Basophils Relative: 1 %
Eosinophils Absolute: 0.2 10*3/uL (ref 0.0–0.5)
Eosinophils Relative: 3 %
HCT: 48.3 % (ref 39.0–52.0)
Hemoglobin: 16.4 g/dL (ref 13.0–17.0)
Immature Granulocytes: 1 %
Lymphocytes Relative: 26 %
Lymphs Abs: 1.5 10*3/uL (ref 0.7–4.0)
MCH: 34.7 pg — ABNORMAL HIGH (ref 26.0–34.0)
MCHC: 34 g/dL (ref 30.0–36.0)
MCV: 102.1 fL — ABNORMAL HIGH (ref 80.0–100.0)
Monocytes Absolute: 0.6 10*3/uL (ref 0.1–1.0)
Monocytes Relative: 10 %
Neutro Abs: 3.5 10*3/uL (ref 1.7–7.7)
Neutrophils Relative %: 59 %
Platelets: 385 10*3/uL (ref 150–400)
RBC: 4.73 MIL/uL (ref 4.22–5.81)
RDW: 13.7 % (ref 11.5–15.5)
WBC: 5.8 10*3/uL (ref 4.0–10.5)
nRBC: 0 % (ref 0.0–0.2)

## 2020-02-23 LAB — BASIC METABOLIC PANEL
Anion gap: 11 (ref 5–15)
BUN: 11 mg/dL (ref 6–20)
CO2: 21 mmol/L — ABNORMAL LOW (ref 22–32)
Calcium: 9.4 mg/dL (ref 8.9–10.3)
Chloride: 107 mmol/L (ref 98–111)
Creatinine, Ser: 0.85 mg/dL (ref 0.61–1.24)
GFR, Estimated: >60 mL/min (ref >60)
Glucose, Bld: 88 mg/dL (ref 70–99)
Potassium: 4.2 mmol/L (ref 3.5–5.1)
Sodium: 139 mmol/L (ref 135–145)

## 2020-02-23 LAB — URIC ACID: Uric Acid, Serum: 5 mg/dL (ref 3.7–8.6)

## 2020-02-23 LAB — SEDIMENTATION RATE: Sed Rate: 2 mm/hr (ref 0–20)

## 2020-02-23 MED ORDER — MELOXICAM 15 MG PO TABS: 15.0000 mg | ORAL_TABLET | Freq: Every day | ORAL | 0 refills | Status: DC

## 2020-02-23 NOTE — ED Provider Notes (Signed)
[facility] Emergency Department Provider Note   ____________________________________________   Event Date/Time   First MD Initiated Contact with Patient 02/23/20 1301     (approximate)  I have reviewed the triage vital signs and the nursing notes.   HISTORY  Chief Complaint Knee Pain    HPI Mario Potter is a 49 y.o. male patient complains of 3 weeks of nontraumatic and intermitting right knee pain and edema.  Patient states he has a history of subluxation of patella which has never been evaluated by orthopedics.  Patient states pain has increased so he now walks with atypical gait favoring the left knee.  Rates pain as a 5/10.  Described pain as "achy".  No palliative measure for complaint.         History reviewed. No pertinent past medical history.  There are no problems to display for this patient.   Past Surgical History:  Procedure Laterality Date  . LEG SURGERY      Prior to Admission medications   Medication Sig Start Date End Date Taking? Authorizing Provider  amoxicillin (AMOXIL) 500 MG tablet Take 1 tablet (500 mg total) by mouth 3 (three) times daily. 11/23/19   Triplett, Rulon Eisenmenger B, FNP  cyclobenzaprine (FLEXERIL) 10 MG tablet Take 1 tablet (10 mg total) by mouth 3 (three) times daily as needed for muscle spasms (or muscle tension). 06/07/19   Shaune Pollack, MD  ibuprofen (ADVIL,MOTRIN) 800 MG tablet Take 1 tablet (800 mg total) by mouth every 8 (eight) hours as needed. 06/19/15   Beers, Charmayne Sheer, PA-C  meloxicam (MOBIC) 15 MG tablet Take 1 tablet (15 mg total) by mouth daily. 11/23/19   Triplett, Rulon Eisenmenger B, FNP  meloxicam (MOBIC) 15 MG tablet Take 1 tablet (15 mg total) by mouth daily. 02/23/20   Joni Reining, PA-C    Allergies Patient has no known allergies.  No family history on file.  Social History Social History   Tobacco Use  . Smoking status: Current Every Day Smoker  . Smokeless tobacco: Never Used  Substance Use Topics   . Alcohol use: No  . Drug use: Yes    Types: Marijuana    Review of Systems Constitutional: No fever/chills Eyes: No visual changes. ENT: No sore throat. Cardiovascular: Denies chest pain. Respiratory: Denies shortness of breath. Gastrointestinal: No abdominal pain.  No nausea, no vomiting.  No diarrhea.  No constipation. Genitourinary: Negative for dysuria. Musculoskeletal: Left knee pain.   Skin: Negative for rash.  Edema right knee. Neurological: Negative for headaches, focal weakness or numbness.  ____________________________________________   PHYSICAL EXAM:  VITAL SIGNS: ED Triage Vitals  Enc Vitals Group     BP 02/23/20 1257 (!) 122/92     Pulse Rate 02/23/20 1257 78     Resp 02/23/20 1257 16     Temp 02/23/20 1257 98.4 F (36.9 C)     Temp Source 02/23/20 1257 Oral     SpO2 02/23/20 1257 95 %     Weight 02/23/20 1255 175 lb 0.7 oz (79.4 kg)     Height 02/23/20 1255 5\' 8"  (1.727 m)     Head Circumference --      Peak Flow --      Pain Score 02/23/20 1255 5     Pain Loc --      Pain Edu? --      Excl. in GC? --    Constitutional: Alert and oriented. Well appearing and in no acute distress. Cardiovascular: Normal  rate, regular rhythm. Grossly normal heart sounds.  Good peripheral circulation. Respiratory: Normal respiratory effort.  No retractions. Lungs CTAB. Gastrointestinal: Soft and nontender. No distention. No abdominal bruits. No CVA tenderness. Musculoskeletal: No obvious deformity to the left knee.  Mild edema.  Mild crepitus with palpation.  Full and equal range of motion.  Able to weight-bear. Neurologic:  Normal speech and language. No gross focal neurologic deficits are appreciated. No gait instability. Skin:  Skin is warm, dry and intact. No rash noted. Psychiatric: Mood and affect are normal. Speech and behavior are normal.  ____________________________________________   LABS (all labs ordered are listed, but only abnormal results are  displayed)  Labs Reviewed  BASIC METABOLIC PANEL - Abnormal; Notable for the following components:      Result Value   CO2 21 (*)    All other components within normal limits  CBC WITH DIFFERENTIAL/PLATELET - Abnormal; Notable for the following components:   MCV 102.1 (*)    MCH 34.7 (*)    All other components within normal limits  URIC ACID  SEDIMENTATION RATE   ____________________________________________  EKG   ____________________________________________  RADIOLOGY I, Joni Reining, personally viewed and evaluated these images (plain radiographs) as part of my medical decision making, as well as reviewing the written report by the radiologist.  ED MD interpretation: X-ray findings consistent with degenerative joint disease.  Official radiology report(s): DG Knee Complete 4 Views Left  Result Date: 02/23/2020 CLINICAL DATA:  Acute left knee pain without recent injury. EXAM: LEFT KNEE - COMPLETE 4+ VIEW COMPARISON:  None. FINDINGS: No evidence of fracture, dislocation, or joint effusion. Severe narrowing of medial joint space is noted with osteophyte formation. Soft tissues are unremarkable. IMPRESSION: Severe degenerative joint disease. No acute abnormality seen in the left knee. Electronically Signed   By: Lupita Raider M.D.   On: 02/23/2020 13:47    ____________________________________________   PROCEDURES  Procedure(s) performed (including Critical Care):  Procedures   ____________________________________________   INITIAL IMPRESSION / ASSESSMENT AND PLAN / ED COURSE  As part of my medical decision making, I reviewed the following data within the electronic MEDICAL RECORD NUMBER         Patient presents with chronic knee pain.  Discussed x-ray findings consistent with severe degenerative changes of the knee.  Patient given discharge care instruction and given elastic knee support.  Patient advised to follow orthopedic for definitive evaluation and treatment.   Take anti-inflammatory medication as directed.      ____________________________________________   FINAL CLINICAL IMPRESSION(S) / ED DIAGNOSES  Final diagnoses:  Arthritis of left knee     ED Discharge Orders         Ordered    meloxicam (MOBIC) 15 MG tablet  Daily        02/23/20 1402          *Please note:  Mario Potter was evaluated in Emergency Department on 02/23/2020 for the symptoms described in the history of present illness. He was evaluated in the context of the global COVID-19 pandemic, which necessitated consideration that the patient might be at risk for infection with the SARS-CoV-2 virus that causes COVID-19. Institutional protocols and algorithms that pertain to the evaluation of patients at risk for COVID-19 are in a state of rapid change based on information released by regulatory bodies including the CDC and federal and state organizations. These policies and algorithms were followed during the patient's care in the ED.  Some ED evaluations and interventions  may be delayed as a result of limited staffing during and the pandemic.*   Note:  This document was prepared using Dragon voice recognition software and may include unintentional dictation errors.    Joni Reining, PA-C 02/23/20 1407    Dionne Bucy, MD 02/28/20 1510

## 2020-02-23 NOTE — ED Triage Notes (Signed)
C/O left knee pain, swelling, grinding.

## 2020-02-23 NOTE — Discharge Instructions (Addendum)
Follow discharge care instruction Wear knee support as needed when ambulating.  Do not sleep in support.  Advised follow orthopedics for definitive evaluation and treatment.

## 2020-03-21 ENCOUNTER — Other Ambulatory Visit: Payer: Self-pay | Admitting: Physician Assistant

## 2021-09-03 ENCOUNTER — Other Ambulatory Visit: Payer: Self-pay

## 2021-09-03 ENCOUNTER — Emergency Department
Admission: EM | Admit: 2021-09-03 | Discharge: 2021-09-03 | Disposition: A | Discharge status: Home / Self Care | Payer: Self-pay | Attending: Emergency Medicine | Admitting: Emergency Medicine

## 2021-09-03 ENCOUNTER — Encounter: Payer: Self-pay | Admitting: Emergency Medicine

## 2021-09-03 DIAGNOSIS — K4090 Unilateral inguinal hernia, without obstruction or gangrene, not specified as recurrent: Secondary | ICD-10-CM | POA: Insufficient documentation

## 2021-09-03 LAB — CBC
HCT: 58.4 % — ABNORMAL HIGH (ref 39.0–52.0)
Hemoglobin: 19.2 g/dL — ABNORMAL HIGH (ref 13.0–17.0)
MCH: 32.5 pg (ref 26.0–34.0)
MCHC: 32.9 g/dL (ref 30.0–36.0)
MCV: 98.8 fL (ref 80.0–100.0)
Platelets: 341 10*3/uL (ref 150–400)
RBC: 5.91 MIL/uL — ABNORMAL HIGH (ref 4.22–5.81)
RDW: 13.5 % (ref 11.5–15.5)
WBC: 9.8 10*3/uL (ref 4.0–10.5)
nRBC: 0 % (ref 0.0–0.2)

## 2021-09-03 LAB — COMPREHENSIVE METABOLIC PANEL
ALT: 15 U/L (ref 0–44)
AST: 20 U/L (ref 15–41)
Albumin: 3.8 g/dL (ref 3.5–5.0)
Alkaline Phosphatase: 57 U/L (ref 38–126)
Anion gap: 11 (ref 5–15)
BUN: 6 mg/dL (ref 6–20)
CO2: 27 mmol/L (ref 22–32)
Calcium: 9.6 mg/dL (ref 8.9–10.3)
Chloride: 103 mmol/L (ref 98–111)
Creatinine, Ser: 0.86 mg/dL (ref 0.61–1.24)
GFR, Estimated: >60 mL/min (ref >60)
Glucose, Bld: 142 mg/dL — ABNORMAL HIGH (ref 70–99)
Potassium: 4.2 mmol/L (ref 3.5–5.1)
Sodium: 141 mmol/L (ref 135–145)
Total Bilirubin: 0.5 mg/dL (ref 0.3–1.2)
Total Protein: 6.8 g/dL (ref 6.5–8.1)

## 2021-09-03 LAB — LIPASE, BLOOD: Lipase: 31 U/L (ref 11–51)

## 2021-09-03 NOTE — ED Provider Notes (Signed)
[facility] Provider Note    Event Date/Time   First MD Initiated Contact with Patient 09/03/21 1222     (approximate)   History   Hernia   HPI  Mario Potter is a 51 y.o. male he presents today for evaluation of possible hernia.  Patient reports that he has noticed an intermittent bulge in his left groin area for the past several months.  He specifically notices this if he coughs or bears down, though it does not happen every time.  He reports that he is able to push it back in easily and it does not hurt.  He has not had any problems urinating or moving his bowels.  He has had no nausea or vomiting.  He has not had any testicular pain or swelling.  He has not noticed any skin color changes.  He came to the emergency department for evaluation due to his father's urging.  There are no problems to display for this patient.         Physical Exam   Triage Vital Signs: ED Triage Vitals  Enc Vitals Group     BP 09/03/21 1126 120/81     Pulse Rate 09/03/21 1126 91     Resp 09/03/21 1126 18     Temp 09/03/21 1126 98.1 F (36.7 C)     Temp Source 09/03/21 1126 Oral     SpO2 09/03/21 1126 94 %     Weight 09/03/21 1224 175 lb 0.7 oz (79.4 kg)     Height 09/03/21 1224 5\' 8"  (1.727 m)     Head Circumference --      Peak Flow --      Pain Score 09/03/21 1125 2     Pain Loc --      Pain Edu? --      Excl. in GC? --     Most recent vital signs: Vitals:   09/03/21 1126 09/03/21 1333  BP: 120/81 122/78  Pulse: 91 88  Resp: 18 18  Temp: 98.1 F (36.7 C)   SpO2: 94% 96%    Physical Exam Vitals and nursing note reviewed.  Constitutional:      General: Awake and alert. No acute distress.    Appearance: Normal appearance. The patient is normal weight.  HENT:     Head: Normocephalic and atraumatic.     Mouth: Mucous membranes are moist.  Eyes:     General: PERRL. Normal EOMs        Right eye: No discharge.        Left eye: No discharge.      Conjunctiva/sclera: Conjunctivae normal.  Cardiovascular:     Rate and Rhythm: Normal rate and regular rhythm.     Pulses: Normal pulses.     Heart sounds: Normal heart sounds Pulmonary:     Effort: Pulmonary effort is normal. No respiratory distress.     Breath sounds: Normal breath sounds.  Abdominal:     Abdomen is soft. There is no abdominal tenderness. No rebound or guarding. No distention.  No visible hernia when laying flat, very small bulge noted in left inguinal area when standing but easily reducible.  Nontender.  No overlying skin color changes. Musculoskeletal:        General: No swelling. Normal range of motion.     Cervical back: Normal range of motion and neck supple.  Skin:    General: Skin is warm and dry.     Capillary Refill: Capillary refill  takes less than 2 seconds.     Findings: No rash.  Neurological:     Mental Status: The patient is awake and alert.      ED Results / Procedures / Treatments   Labs (all labs ordered are listed, but only abnormal results are displayed) Labs Reviewed  COMPREHENSIVE METABOLIC PANEL - Abnormal; Notable for the following components:      Result Value   Glucose, Bld 142 (*)    All other components within normal limits  CBC - Abnormal; Notable for the following components:   RBC 5.91 (*)    Hemoglobin 19.2 (*)    HCT 58.4 (*)    All other components within normal limits  LIPASE, BLOOD  URINALYSIS, ROUTINE W REFLEX MICROSCOPIC     EKG     RADIOLOGY     PROCEDURES:  Critical Care performed:   Procedures   MEDICATIONS ORDERED IN ED: Medications - No data to display   IMPRESSION / MDM / ASSESSMENT AND PLAN / ED COURSE  I reviewed the triage vital signs and the nursing notes.   Differential diagnosis includes, but is not limited to, indirect hernia, direct hernia, lymphadenopathy, incarcerated or strangulated hernia.  Patient is awake and alert, hemodynamically stable and afebrile.  No visible hernia when  laying flat, small bulge noted when standing with coughing.  It is easily reducible.  There are no signs or symptoms of strangulated or incarcerated hernia.  I recommended outpatient follow-up with general surgery for elective repair.  Labs obtained in triage are overall reassuring.  Patient understands and agrees.  We did discuss return precautions.  He was discharged in stable condition.   Patient's presentation is most consistent with acute complicated illness / injury requiring diagnostic workup.        FINAL CLINICAL IMPRESSION(S) / ED DIAGNOSES   Final diagnoses:  Unilateral inguinal hernia without obstruction or gangrene, recurrence not specified     Rx / DC Orders   ED Discharge Orders     None        Note:  This document was prepared using Dragon voice recognition software and may include unintentional dictation errors.   Keturah Shavers 09/03/21 1535    Chesley Noon, MD 09/07/21 [phone]

## 2021-09-03 NOTE — ED Triage Notes (Addendum)
Pt comes pov with possible hernia. Pt states when he coughs a "bubble" pops out and he has to push it back in. When asked about pain, pt states "it's not pain per se, I just relax and it goes away."

## 2021-09-03 NOTE — Discharge Instructions (Signed)
Please return if your hernia is no longer reducible, or if you develop constipation, urinary retention, vomiting, or overlying skin color changes or severe pain.  Please return for any new, worsening, or change in symptoms or other concerns.  Please make a follow-up appointment with a general surgeon to arrange surgical repair.

## 2021-09-04 ENCOUNTER — Telehealth: Payer: Self-pay

## 2021-09-18 ENCOUNTER — Ambulatory Visit: Payer: Self-pay | Admitting: Surgery

## 2021-11-06 ENCOUNTER — Ambulatory Visit (INDEPENDENT_AMBULATORY_CARE_PROVIDER_SITE_OTHER): Payer: Self-pay | Admitting: Surgery

## 2021-11-06 ENCOUNTER — Encounter: Payer: Self-pay | Admitting: Surgery

## 2021-11-06 VITALS — BP 126/84 | HR 66 | Temp 98.1°F | Ht 68.0 in | Wt 159.0 lb

## 2021-11-06 DIAGNOSIS — K4090 Unilateral inguinal hernia, without obstruction or gangrene, not specified as recurrent: Secondary | ICD-10-CM
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
## 2021-11-06 NOTE — Patient Instructions (Addendum)
Accepting new patients primary care  Mario Potter [phone] [facility]-[phone]   Call us once you decide you would like to have surgery done-[phone].  You have chose to have your hernia repaired. This will be done by Dr. Aleen Campi at [facility].  Please see your (blue) Pre-care information that you have been given today. Our surgery scheduler will call you to verify surgery date and to go over information.   You will need to arrange to be out of work for approximately 1-2 weeks and then you may return with a lifting restriction for 4 more weeks. If you have FMLA or Disability paperwork that needs to be filled out, please have your company fax your paperwork to [phone] or you may drop this by either office. This paperwork will be filled out within 3 days after your surgery has been completed.  You may have a bruise in your groin and also swelling and brusing in your testicle area. You may use ice 4-5 times daily for 15-20 minutes each time. Make sure that you place a barrier between you and the ice pack. To decrease the swelling, you may roll up a bath towel and place it vertically in between your thighs with your testicles resting on the towel. You will want to keep this area elevated as much as possible for several days following surgery.    Inguinal Hernia, Adult Muscles help keep everything in the body in its proper place. But if a weak spot in the muscles develops, something can poke through. That is called a hernia. When this happens in the lower part of the belly (abdomen), it is called an inguinal hernia. (It takes its name from a part of the body in this region called the inguinal canal.) A weak spot in the wall of muscles lets some fat or part of the small intestine bulge through. An inguinal hernia can develop at any age. Men get them more often than women. CAUSES  In adults, an inguinal hernia develops over time. It can be triggered by: Suddenly  straining the muscles of the lower abdomen. Lifting heavy objects. Straining to have a bowel movement. Difficult bowel movements (constipation) can lead to this. Constant coughing. This may be caused by smoking or lung disease. Being overweight. Being pregnant. Working at a job that requires long periods of standing or heavy lifting. Having had an inguinal hernia before. One type can be an emergency situation. It is called a strangulated inguinal hernia. It develops if part of the small intestine slips through the weak spot and cannot get back into the abdomen. The blood supply can be cut off. If that happens, part of the intestine may die. This situation requires emergency surgery. SYMPTOMS  Often, a small inguinal hernia has no symptoms. It is found when a healthcare provider does a physical exam. Larger hernias usually have symptoms.  In adults, symptoms may include: A lump in the groin. This is easier to see when the person is standing. It might disappear when lying down. In men, a lump in the scrotum. Pain or burning in the groin. This occurs especially when lifting, straining or coughing. A dull ache or feeling of pressure in the groin. Signs of a strangulated hernia can include: A bulge in the groin that becomes very painful and tender to the touch. A bulge that turns red or purple. Fever, nausea and vomiting. Inability to have a bowel movement or to pass gas. DIAGNOSIS  To decide if you have an  inguinal hernia, a healthcare provider will probably do a physical examination. This will include asking questions about any symptoms you have noticed. The healthcare provider might feel the groin area and ask you to cough. If an inguinal hernia is felt, the healthcare provider may try to slide it back into the abdomen. Usually no other tests are needed. TREATMENT  Treatments can vary. The size of the hernia makes a difference. Options include: Watchful waiting. This is often suggested if  the hernia is small and you have had no symptoms. No medical procedure will be done unless symptoms develop. You will need to watch closely for symptoms. If any occur, contact your healthcare provider right away. Surgery. This is used if the hernia is larger or you have symptoms. Open surgery. This is usually an outpatient procedure (you will not stay overnight in a hospital). An cut (incision) is made through the skin in the groin. The hernia is put back inside the abdomen. The weak area in the muscles is then repaired by herniorrhaphy or hernioplasty. Herniorrhaphy: in this type of surgery, the weak muscles are sewn back together. Hernioplasty: a patch or mesh is used to close the weak area in the abdominal wall. Laparoscopy. In this procedure, a surgeon makes small incisions. A thin tube with a tiny video camera (called a laparoscope) is put into the abdomen. The surgeon repairs the hernia with mesh by looking with the video camera and using two long instruments. HOME CARE INSTRUCTIONS  After surgery to repair an inguinal hernia: You will need to take pain medicine prescribed by your healthcare provider. Follow all directions carefully. You will need to take care of the wound from the incision. Your activity will be restricted for awhile. This will probably include no heavy lifting for several weeks. You also should not do anything too active for a few weeks. When you can return to work will depend on the type of job that you have. During "watchful waiting" periods, you should: Maintain a healthy weight. Eat a diet high in fiber (fruits, vegetables and whole grains). Drink plenty of fluids to avoid constipation. This means drinking enough water and other liquids to keep your urine clear or pale yellow. Do not lift heavy objects. Do not stand for long periods of time. Quit smoking. This should keep you from developing a frequent cough. SEEK MEDICAL CARE IF:  A bulge develops in your groin  area. You feel pain, a burning sensation or pressure in the groin. This might be worse if you are lifting or straining. You develop a fever of more than 100.5 F (38.1 C). SEEK IMMEDIATE MEDICAL CARE IF:  Pain in the groin increases suddenly. A bulge in the groin gets bigger suddenly and does not go down. For men, there is sudden pain in the scrotum. Or, the size of the scrotum increases. A bulge in the groin area becomes red or purple and is painful to touch. You have nausea or vomiting that does not go away. You feel your heart beating much faster than normal. You cannot have a bowel movement or pass gas. You develop a fever of more than 102.0 F (38.9 C).   This information is not intended to replace advice given to you by your health care provider. Make sure you discuss any questions you have with your health care provider.   Document Released: 07/18/2008 Document Revised: 05/24/2011 Document Reviewed: 09/02/2014 Elsevier Interactive Patient Education [employer]

## 2022-03-18 DIAGNOSIS — F17210 Nicotine dependence, cigarettes, uncomplicated: Secondary | ICD-10-CM | POA: Diagnosis not present

## 2022-03-18 DIAGNOSIS — Z20822 Contact with and (suspected) exposure to covid-19: Secondary | ICD-10-CM | POA: Diagnosis not present

## 2022-03-18 DIAGNOSIS — R6883 Chills (without fever): Secondary | ICD-10-CM | POA: Diagnosis not present

## 2022-03-18 DIAGNOSIS — R52 Pain, unspecified: Secondary | ICD-10-CM | POA: Diagnosis not present

## 2022-03-18 DIAGNOSIS — F1090 Alcohol use, unspecified, uncomplicated: Secondary | ICD-10-CM | POA: Diagnosis not present

## 2022-03-18 DIAGNOSIS — R197 Diarrhea, unspecified: Secondary | ICD-10-CM | POA: Diagnosis not present

## 2022-03-18 DIAGNOSIS — F1299 Cannabis use, unspecified with unspecified cannabis-induced disorder: Secondary | ICD-10-CM | POA: Diagnosis not present

## 2022-03-18 DIAGNOSIS — Z1152 Encounter for screening for COVID-19: Secondary | ICD-10-CM | POA: Diagnosis not present

## 2022-03-18 DIAGNOSIS — R110 Nausea: Secondary | ICD-10-CM | POA: Diagnosis not present

## 2023-08-16 ENCOUNTER — Other Ambulatory Visit: Payer: Self-pay

## 2023-08-16 ENCOUNTER — Emergency Department
Admission: EM | Admit: 2023-08-16 | Discharge: 2023-08-16 | Disposition: A | Discharge status: Home / Self Care | Attending: Emergency Medicine | Admitting: Emergency Medicine

## 2023-08-16 DIAGNOSIS — K4190 Unilateral femoral hernia, without obstruction or gangrene, not specified as recurrent: Secondary | ICD-10-CM | POA: Insufficient documentation

## 2023-08-16 DIAGNOSIS — K4090 Unilateral inguinal hernia, without obstruction or gangrene, not specified as recurrent: Secondary | ICD-10-CM | POA: Diagnosis not present

## 2023-08-16 DIAGNOSIS — R1032 Left lower quadrant pain: Secondary | ICD-10-CM | POA: Diagnosis present

## 2023-08-16 LAB — COMPREHENSIVE METABOLIC PANEL WITH GFR
ALT: 21 U/L (ref 0–44)
AST: 26 U/L (ref 15–41)
Albumin: 3.9 g/dL (ref 3.5–5.0)
Alkaline Phosphatase: 55 U/L (ref 38–126)
Anion gap: 9 (ref 5–15)
BUN: 11 mg/dL (ref 6–20)
CO2: 27 mmol/L (ref 22–32)
Calcium: 9.4 mg/dL (ref 8.9–10.3)
Chloride: 103 mmol/L (ref 98–111)
Creatinine, Ser: 0.89 mg/dL (ref 0.61–1.24)
GFR, Estimated: >60 mL/min (ref >60)
Glucose, Bld: 98 mg/dL (ref 70–99)
Potassium: 4.3 mmol/L (ref 3.5–5.1)
Sodium: 139 mmol/L (ref 135–145)
Total Bilirubin: 0.6 mg/dL (ref 0.0–1.2)
Total Protein: 6.8 g/dL (ref 6.5–8.1)

## 2023-08-16 LAB — CBC
HCT: 47.4 % (ref 39.0–52.0)
Hemoglobin: 15.3 g/dL (ref 13.0–17.0)
MCH: 32.2 pg (ref 26.0–34.0)
MCHC: 32.3 g/dL (ref 30.0–36.0)
MCV: 99.8 fL (ref 80.0–100.0)
Platelets: 371 10*3/uL (ref 150–400)
RBC: 4.75 MIL/uL (ref 4.22–5.81)
RDW: 14.6 % (ref 11.5–15.5)
WBC: 7.5 10*3/uL (ref 4.0–10.5)
nRBC: 0 % (ref 0.0–0.2)

## 2023-08-16 LAB — LIPASE, BLOOD: Lipase: 39 U/L (ref 11–51)

## 2023-08-16 NOTE — Discharge Instructions (Signed)
 Avoid heavy lifting or activities that cause the hernia to pop out.   If it does come out again, push it back in as soon as possible. If you can't get it to go back in or if you develop pain, start vomiting, or the area becomes discolored, please return to the ER.  Call and schedule an appointment with the surgeon.

## 2023-08-16 NOTE — ED Triage Notes (Signed)
 Pt here via POV with c/o of "something popping out and I can push it back in". PT denies pain, pt states this is located on left lower ABD. NAD noted. Pt states this has been ongoing "for awhile".

## 2023-08-16 NOTE — ED Notes (Signed)
 Discharge instructions reviewed with patient. Patient questions answered and opportunity for education reviewed. Patient voices understanding of discharge instructions with no further questions. Patient ambulatory with steady gait to lobby.

## 2023-08-16 NOTE — ED Provider Notes (Signed)
 [facility] Provider Note    Event Date/Time   First MD Initiated Contact with Patient 08/16/23 1234     (approximate)   History   Abdominal Pain   HPI  Mario Potter is a 53 y.o. male with no significant past medical history and as listed in EMR presents to the emergency department for treatment and evaluation of "something popping out"  on the left lower abdomen that he can push back in.  He has been diagnosed in the past with an inguinal hernia.  He has not followed through with surgical intervention.  He states that he is always able to reduce that this.  He denies associated nausea or vomiting but states that sometimes once he pushes it back in he feels like he needs to have a bowel movement.  He cannot currently feel the bulge.       Physical Exam   Triage Vital Signs: ED Triage Vitals  Encounter Vitals Group     BP 08/16/23 1136 (!) 156/94     Systolic BP Percentile --      Diastolic BP Percentile --      Pulse Rate 08/16/23 1136 60     Resp 08/16/23 1136 15     Temp 08/16/23 1136 98.7 F (37.1 C)     Temp Source 08/16/23 1136 Oral     SpO2 08/16/23 1136 99 %     Weight 08/16/23 1135 158 lb 11.7 oz (72 kg)     Height 08/16/23 1135 5\' 8"  (1.727 m)     Head Circumference --      Peak Flow --      Pain Score 08/16/23 1134 0     Pain Loc --      Pain Education --      Exclude from Growth Chart --     Most recent vital signs: Vitals:   08/16/23 1136 08/16/23 1326  BP: (!) 156/94 (!) 151/90  Pulse: 60 (!) 59  Resp: 15 16  Temp: 98.7 F (37.1 C) 99 F (37.2 C)  SpO2: 99% 100%    General: Awake, no distress.  CV:  Good peripheral perfusion.  Resp:  Normal effort.  Abd:  No distention.  No obvious bulge in the left lower abdomen where patient indicates. Other:     ED Results / Procedures / Treatments   Labs (all labs ordered are listed, but only abnormal results are displayed) Labs Reviewed  LIPASE, BLOOD  COMPREHENSIVE  METABOLIC PANEL WITH GFR  CBC     EKG  Not indicated   RADIOLOGY  Image and radiology report reviewed and interpreted by me. Radiology report consistent with the same.  Not indicated  PROCEDURES:  Critical Care performed: No  Procedures   MEDICATIONS ORDERED IN ED:  Medications - No data to display   IMPRESSION / MDM / ASSESSMENT AND PLAN / ED COURSE   I have reviewed the triage note.  Differential diagnosis includes, but is not limited to, femoral hernia, inguinal hernia, incarcerated hernia  Patient's presentation is most consistent with acute illness / injury with system symptoms.  53 year old male presenting to the emergency department for evaluation of bulge in the left lower abdomen that occurs intermittently.  It is not currently there.  He denies feeling nauseated and has not been vomiting.  He reports that it is coming out more frequently than it used to.  Exam is reassuring.  No hernia identified at this time.  Vital signs indicate  mild, asymptomatic hypertension, pulse rate of 59, SPO2 100 with a respiratory rate of 16, and temp of 99.  Patient will be referred to general surgery and strict ER return precautions discussed.      FINAL CLINICAL IMPRESSION(S) / ED DIAGNOSES   Final diagnoses:  Femoral hernia of left side     Rx / DC Orders   ED Discharge Orders     None        Note:  This document was prepared using Dragon voice recognition software and may include unintentional dictation errors.   Sherryle Don, FNP 08/17/23 1237    Viviano Ground, MD 08/22/23 1027

## 2023-12-06 ENCOUNTER — Encounter: Payer: Self-pay | Admitting: Family Medicine

## 2023-12-15 ENCOUNTER — Emergency Department
Admission: EM | Admit: 2023-12-15 | Discharge: 2023-12-15 | Disposition: A | Discharge status: Home / Self Care | Attending: Emergency Medicine | Admitting: Emergency Medicine

## 2023-12-15 ENCOUNTER — Other Ambulatory Visit: Payer: Self-pay

## 2023-12-15 DIAGNOSIS — S139XXA Sprain of joints and ligaments of unspecified parts of neck, initial encounter: Secondary | ICD-10-CM

## 2023-12-15 DIAGNOSIS — Y92410 Unspecified street and highway as the place of occurrence of the external cause: Secondary | ICD-10-CM | POA: Insufficient documentation

## 2023-12-15 DIAGNOSIS — S134XXA Sprain of ligaments of cervical spine, initial encounter: Secondary | ICD-10-CM | POA: Diagnosis not present

## 2023-12-15 DIAGNOSIS — S199XXA Unspecified injury of neck, initial encounter: Secondary | ICD-10-CM | POA: Diagnosis present

## 2023-12-15 MED ORDER — METHOCARBAMOL 500 MG PO TABS: 500.0000 mg | ORAL_TABLET | Freq: Three times a day (TID) | ORAL | 1 refills | Status: AC | PRN

## 2023-12-15 MED ORDER — KETOROLAC TROMETHAMINE 30 MG/ML IJ SOLN
30.0000 mg | Freq: Once | INTRAMUSCULAR | Status: AC
  Administered 2023-12-15: 30 mg via INTRAMUSCULAR
  Filled 2023-12-15: qty 1

## 2023-12-15 MED ORDER — NAPROXEN 500 MG PO TABS: 500.0000 mg | ORAL_TABLET | Freq: Two times a day (BID) | ORAL | 2 refills | Status: AC
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
